# Patient Record
Sex: Female | Born: 1970 | Race: White | Hispanic: No | Marital: Single | Smoking: Never smoker
Health system: Southern US, Community
[De-identification: ages and names within clinical notes are randomized; demographics above are authoritative.]

## PROBLEM LIST (undated history)

## (undated) DIAGNOSIS — S161XXA Strain of muscle, fascia and tendon at neck level, initial encounter: Secondary | ICD-10-CM

## (undated) DIAGNOSIS — I340 Nonrheumatic mitral (valve) insufficiency: Secondary | ICD-10-CM

## (undated) DIAGNOSIS — M797 Fibromyalgia: Secondary | ICD-10-CM

## (undated) DIAGNOSIS — G43909 Migraine, unspecified, not intractable, without status migrainosus: Secondary | ICD-10-CM

## (undated) DIAGNOSIS — R42 Dizziness and giddiness: Secondary | ICD-10-CM

## (undated) DIAGNOSIS — F419 Anxiety disorder, unspecified: Secondary | ICD-10-CM

---

## 2017-12-10 ENCOUNTER — Other Ambulatory Visit: Payer: Self-pay | Admitting: Orthopedic Surgery

## 2017-12-10 DIAGNOSIS — M545 Low back pain: Secondary | ICD-10-CM

## 2017-12-10 DIAGNOSIS — M542 Cervicalgia: Secondary | ICD-10-CM

## 2017-12-22 ENCOUNTER — Ambulatory Visit
Admission: RE | Admit: 2017-12-22 | Discharge: 2017-12-22 | Disposition: A | Discharge status: Home / Self Care | Payer: Medicare Other | Source: Ambulatory Visit | Attending: Orthopedic Surgery | Admitting: Orthopedic Surgery

## 2017-12-22 DIAGNOSIS — M545 Low back pain: Secondary | ICD-10-CM

## 2017-12-22 DIAGNOSIS — M542 Cervicalgia: Secondary | ICD-10-CM

## 2018-10-18 ENCOUNTER — Emergency Department (HOSPITAL_COMMUNITY)
Admission: EM | Admit: 2018-10-18 | Discharge: 2018-10-18 | Disposition: A | Discharge status: Home / Self Care | Payer: Medicare Other | Attending: Emergency Medicine | Admitting: Emergency Medicine

## 2018-10-18 ENCOUNTER — Other Ambulatory Visit: Payer: Self-pay

## 2018-10-18 ENCOUNTER — Encounter (HOSPITAL_COMMUNITY): Payer: Self-pay | Admitting: Emergency Medicine

## 2018-10-18 DIAGNOSIS — R197 Diarrhea, unspecified: Secondary | ICD-10-CM

## 2018-10-18 DIAGNOSIS — R55 Syncope and collapse: Secondary | ICD-10-CM | POA: Diagnosis not present

## 2018-10-18 DIAGNOSIS — R112 Nausea with vomiting, unspecified: Secondary | ICD-10-CM | POA: Diagnosis not present

## 2018-10-18 DIAGNOSIS — Z79899 Other long term (current) drug therapy: Secondary | ICD-10-CM | POA: Diagnosis not present

## 2018-10-18 HISTORY — DX: Fibromyalgia: M79.7

## 2018-10-18 HISTORY — DX: Migraine, unspecified, not intractable, without status migrainosus: G43.909

## 2018-10-18 HISTORY — DX: Strain of muscle, fascia and tendon at neck level, initial encounter: S16.1XXA

## 2018-10-18 HISTORY — DX: Anxiety disorder, unspecified: F41.9

## 2018-10-18 HISTORY — DX: Nonrheumatic mitral (valve) insufficiency: I34.0

## 2018-10-18 HISTORY — DX: Dizziness and giddiness: R42

## 2018-10-18 LAB — CBC
HCT: 41 % (ref 36.0–46.0)
HEMOGLOBIN: 13.4 g/dL (ref 12.0–15.0)
MCH: 29.5 pg (ref 26.0–34.0)
MCHC: 32.7 g/dL (ref 30.0–36.0)
MCV: 90.1 fL (ref 80.0–100.0)
Platelets: 281 10*3/uL (ref 150–400)
RBC: 4.55 MIL/uL (ref 3.87–5.11)
RDW: 12.7 % (ref 11.5–15.5)
WBC: 8 10*3/uL (ref 4.0–10.5)
nRBC: 0 % (ref 0.0–0.2)

## 2018-10-18 LAB — COMPREHENSIVE METABOLIC PANEL
ALK PHOS: 82 U/L (ref 38–126)
ALT: 10 U/L (ref 0–44)
ANION GAP: 10 (ref 5–15)
AST: 18 U/L (ref 15–41)
Albumin: 4 g/dL (ref 3.5–5.0)
BUN: 18 mg/dL (ref 6–20)
CALCIUM: 8.8 mg/dL — AB (ref 8.9–10.3)
CO2: 23 mmol/L (ref 22–32)
Chloride: 102 mmol/L (ref 98–111)
Creatinine, Ser: 0.86 mg/dL (ref 0.44–1.00)
GFR calc Af Amer: >60 mL/min (ref >60)
GFR calc non Af Amer: >60 mL/min (ref >60)
Glucose, Bld: 130 mg/dL — ABNORMAL HIGH (ref 70–99)
Potassium: 3.7 mmol/L (ref 3.5–5.1)
Sodium: 135 mmol/L (ref 135–145)
TOTAL PROTEIN: 7.5 g/dL (ref 6.5–8.1)
Total Bilirubin: 0.6 mg/dL (ref 0.3–1.2)

## 2018-10-18 LAB — I-STAT BETA HCG BLOOD, ED (MC, WL, AP ONLY)

## 2018-10-18 LAB — LIPASE, BLOOD: Lipase: 27 U/L (ref 11–51)

## 2018-10-18 MED ORDER — ONDANSETRON HCL 4 MG/2ML IJ SOLN
4.0000 mg | Freq: Once | INTRAMUSCULAR | Status: AC
  Administered 2018-10-18: 4 mg via INTRAVENOUS
  Filled 2018-10-18: qty 2

## 2018-10-18 MED ORDER — SODIUM CHLORIDE 0.9 % IV BOLUS
1000.0000 mL | Freq: Once | INTRAVENOUS | Status: AC
  Administered 2018-10-18: 1000 mL via INTRAVENOUS

## 2018-10-18 MED ORDER — ONDANSETRON HCL 8 MG PO TABS: 8.0000 mg | ORAL_TABLET | Freq: Three times a day (TID) | ORAL | 0 refills | Status: DC | PRN

## 2018-10-18 MED ORDER — SODIUM CHLORIDE 0.9% FLUSH: 3.0000 mL | Freq: Once | INTRAVENOUS | Status: DC

## 2018-10-18 NOTE — ED Notes (Signed)
Pt had a cup of soda done well 

## 2018-10-18 NOTE — ED Triage Notes (Signed)
Pt c/o vomiting and diarrhea since last night. Pt reports she had a syncopal episode when getting up last night. Pt states daughter had been sick several days ago. Pt also c/o pins and needles feeling to head.

## 2018-10-18 NOTE — Discharge Instructions (Addendum)
Chart with a clear liquid diet then gradually advance to regular foods after a day or 2.  See the doctor of your choice for problems.
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
## 2019-05-16 IMAGING — MR MR CERVICAL SPINE W/O CM
5 series · 29 of 48 positions shown · non-contrast
Comparison: None.

CLINICAL DATA: 46 y/o F; left-sided neck pain with posterior
headache. Numbness in bilateral arms and hands.

EXAM:
MRI CERVICAL SPINE WITHOUT CONTRAST
TECHNIQUE: Multiplanar, multisequence MR imaging of the cervical spine was
performed. No intravenous contrast was administered.

[Series 6: T1 · sagittal · 3.3mm · 0.66mm/px · 6 of 13 slices shown]
[im 1/13]
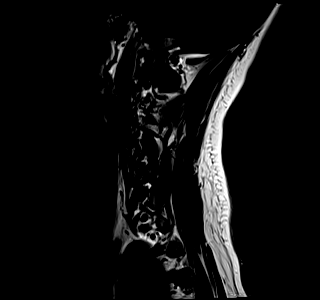
[im 3/13]
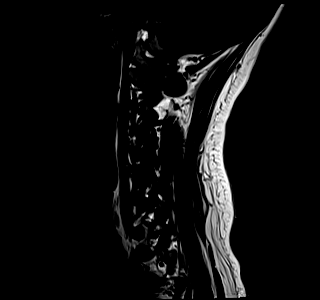
[im 5/13]
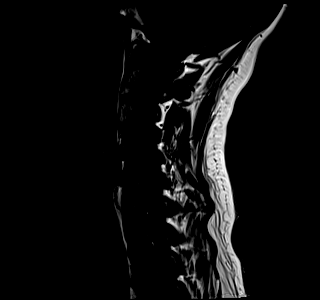
[im 8/13]
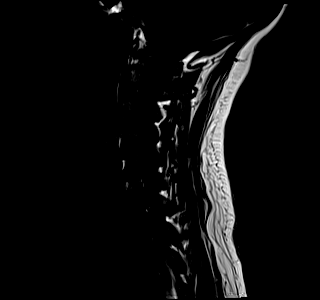
[im 10/13]
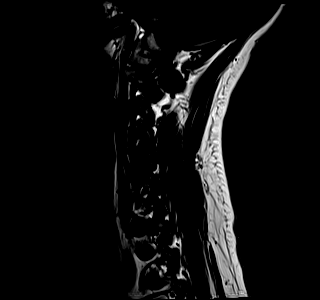
[im 13/13]
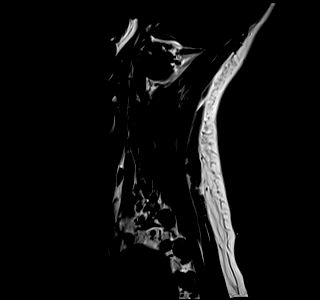

[Series 7: T2 · sagittal · 3.3mm · 0.55mm/px · 7 of 13 slices shown (1 of 2)]
[im 1/13]
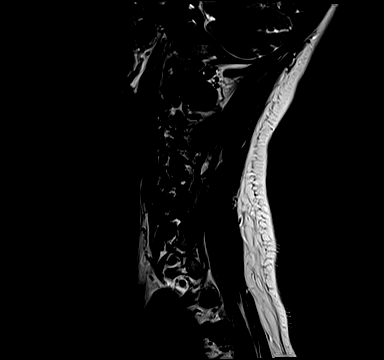
[im 3/13]
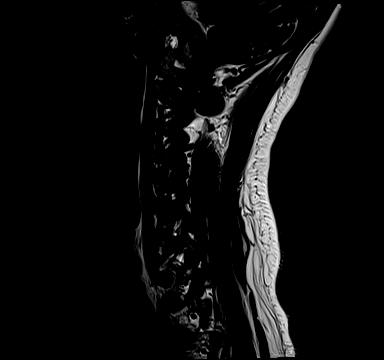
[im 5/13]
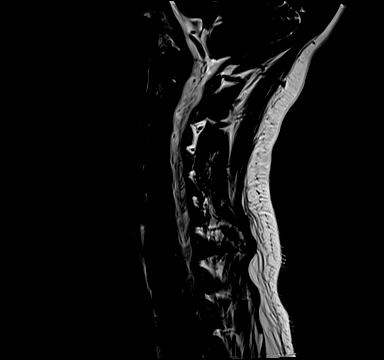
[im 7/13]
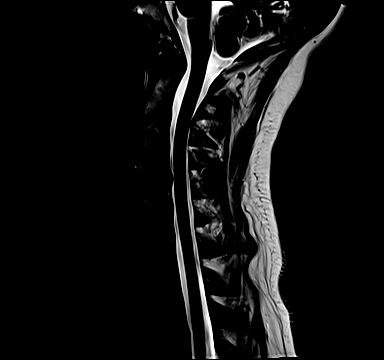
[im 9/13]
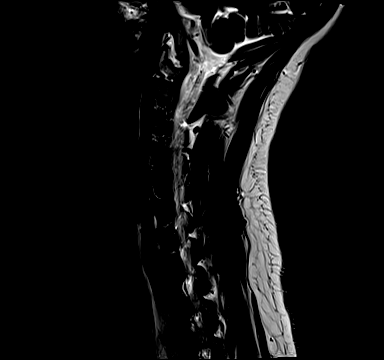
[im 11/13]
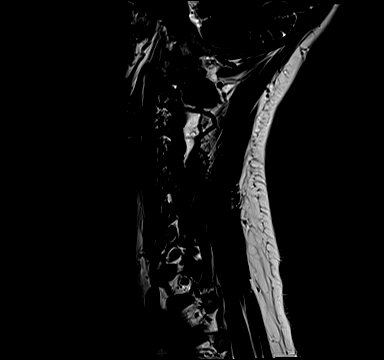
[im 13/13]
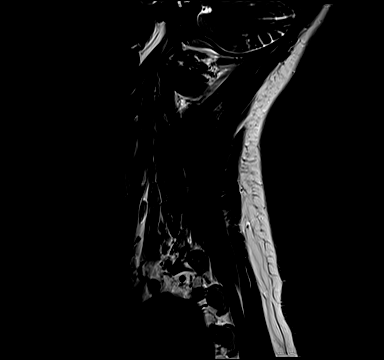

[Series 8: STIR · sagittal · 3.3mm · 0.33mm/px · 7 of 13 slices shown]
[im 1/13]
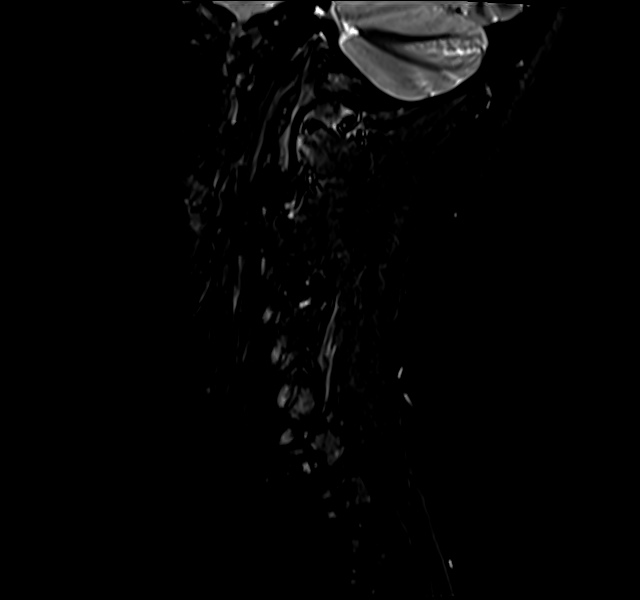
[im 3/13]
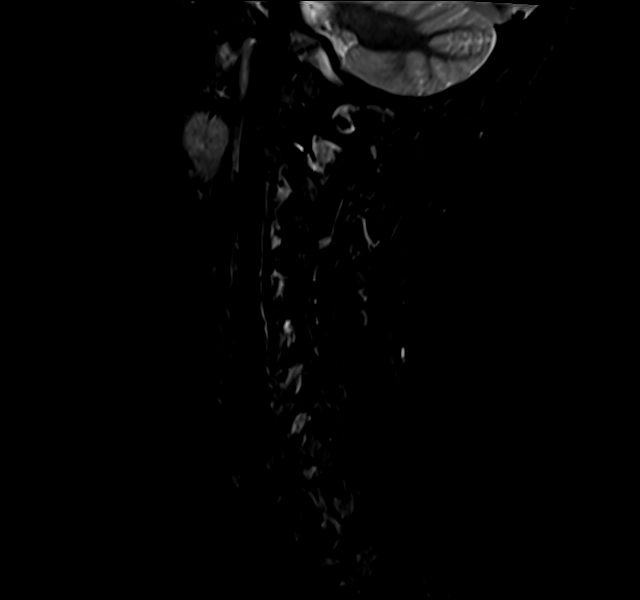
[im 5/13]
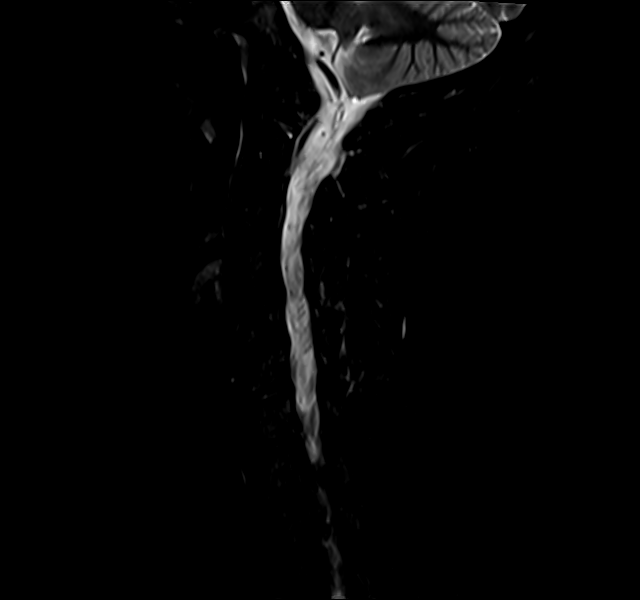
[im 7/13]
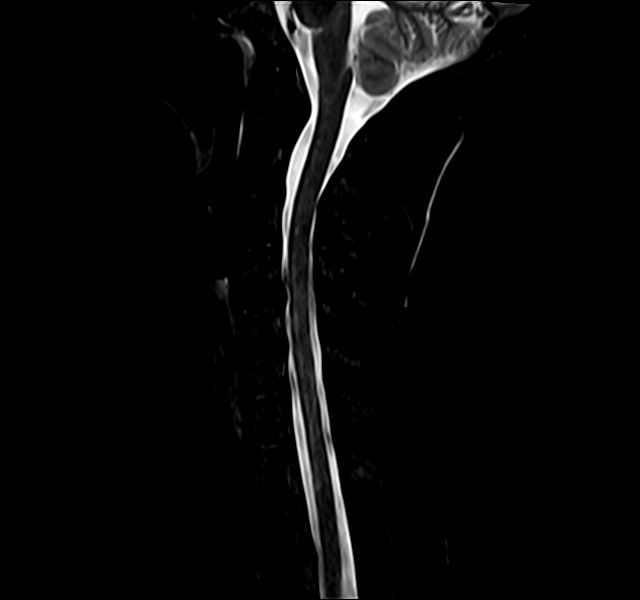
[im 9/13]
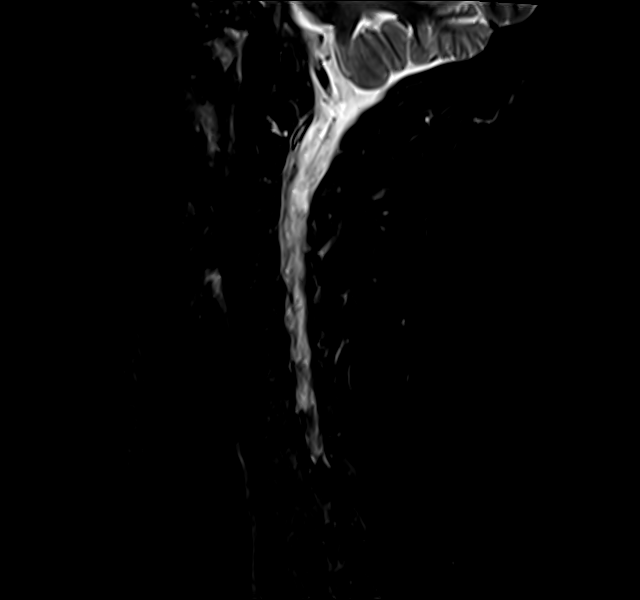
[im 11/13]
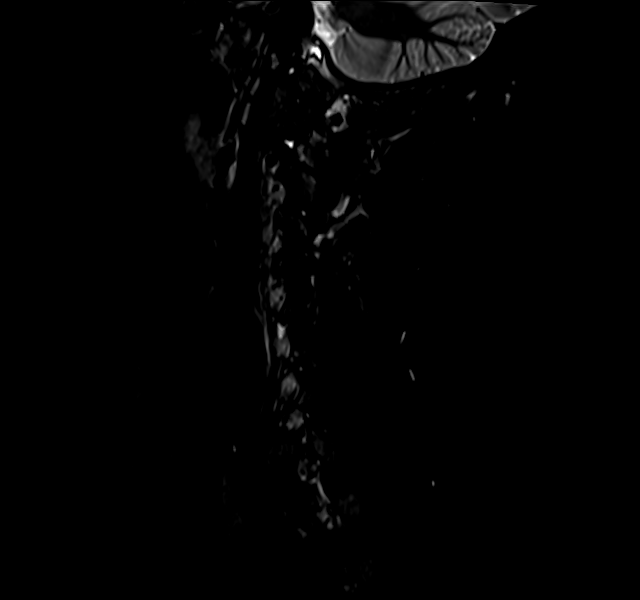
[im 13/13]
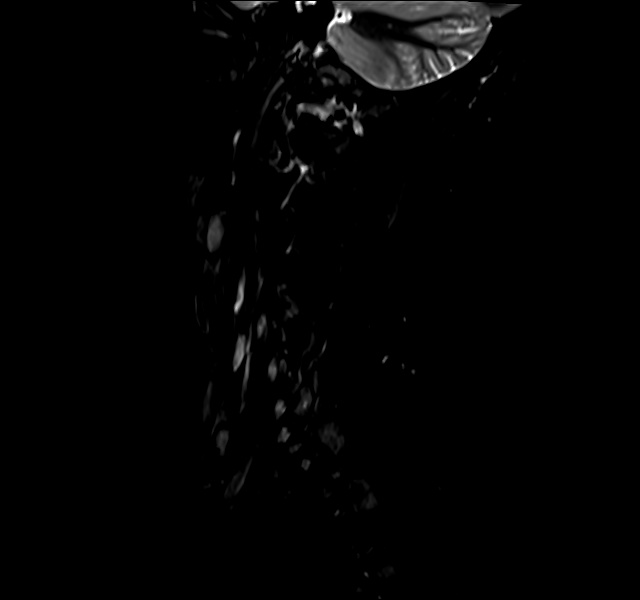

[Series 9: T2 · axial · 3.0mm · 0.53mm/px · z∈[-50,+36]mm · 8 of 28 slices shown (2 of 2)]
[im 1/28]
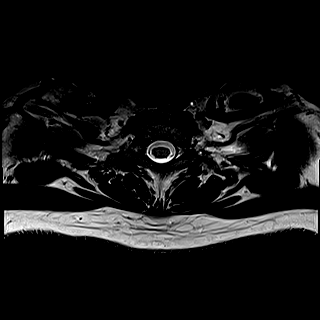
[im 5/28]
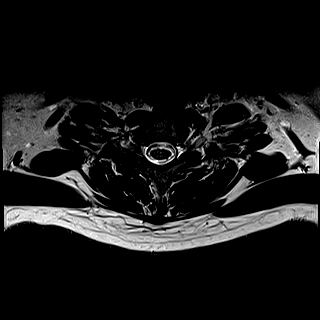
[im 9/28]
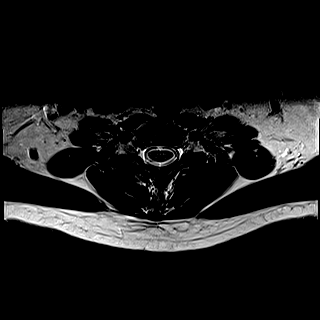
[im 13/28]
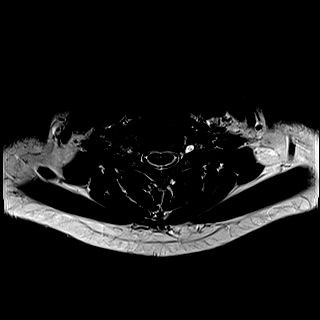
[im 15/28]
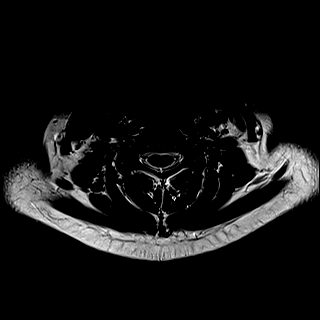
[im 19/28]
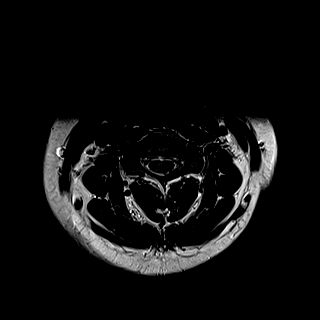
[im 23/28]
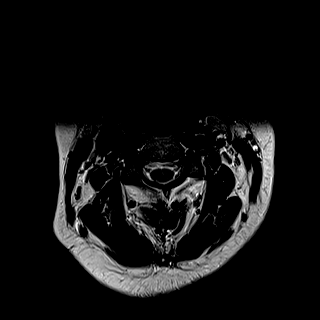
[im 28/28]
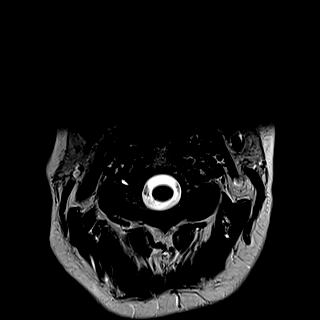

[Series 10: GRE · axial · 3.0mm · 0.66mm/px · 1 of 28 slices shown]
[im 1/28]
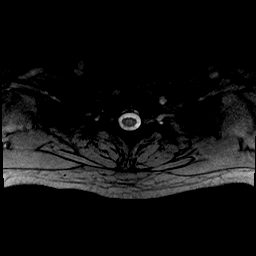

[29 of 48 positions shown; findings below may reference images not displayed]

FINDINGS: Alignment: Physiologic.

Vertebrae: No fracture, evidence of discitis, or bone lesion.

Cord: Normal signal and morphology.

Posterior Fossa, vertebral arteries, paraspinal tissues: Negative.

Disc levels:

C2-3: No significant disc displacement, foraminal stenosis, or canal
stenosis.

C3-4: No significant disc displacement, foraminal stenosis, or canal
stenosis.

C4-5: Small central protrusion with ventral thecal sac effacement.
No significant foraminal or canal stenosis.

C5-6: Small disc bulge with ventral thecal sac effacement. No
significant foraminal or canal stenosis.

C6-7: No significant disc displacement, foraminal stenosis, or canal
stenosis.

C7-T1: No significant disc displacement, foraminal stenosis, or
canal stenosis.
IMPRESSION: 1. No acute osseous or cord signal abnormality.
2. Mild discogenic degenerative changes predominantly at the C4-5
and C5-6 levels.
3. No significant foraminal or canal stenosis.

By: Don Lolito Onacram M.D.

## 2021-08-31 ENCOUNTER — Encounter: Payer: Self-pay | Admitting: Neurology
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
## 2021-11-27 ENCOUNTER — Ambulatory Visit: Payer: Medicare HMO | Admitting: Neurology

## 2022-07-12 DIAGNOSIS — S0083XA Contusion of other part of head, initial encounter: Secondary | ICD-10-CM | POA: Diagnosis not present

## 2022-07-18 DIAGNOSIS — Z136 Encounter for screening for cardiovascular disorders: Secondary | ICD-10-CM | POA: Diagnosis not present

## 2022-07-18 DIAGNOSIS — D649 Anemia, unspecified: Secondary | ICD-10-CM | POA: Diagnosis not present

## 2022-07-18 DIAGNOSIS — E7800 Pure hypercholesterolemia, unspecified: Secondary | ICD-10-CM | POA: Diagnosis not present

## 2022-07-18 DIAGNOSIS — Z0000 Encounter for general adult medical examination without abnormal findings: Secondary | ICD-10-CM | POA: Diagnosis not present

## 2022-07-18 DIAGNOSIS — M5382 Other specified dorsopathies, cervical region: Secondary | ICD-10-CM | POA: Diagnosis not present

## 2022-07-18 DIAGNOSIS — Z862 Personal history of diseases of the blood and blood-forming organs and certain disorders involving the immune mechanism: Secondary | ICD-10-CM | POA: Diagnosis not present

## 2022-07-18 DIAGNOSIS — G500 Trigeminal neuralgia: Secondary | ICD-10-CM | POA: Diagnosis not present

## 2022-07-18 DIAGNOSIS — G43109 Migraine with aura, not intractable, without status migrainosus: Secondary | ICD-10-CM | POA: Diagnosis not present

## 2022-07-18 DIAGNOSIS — Z8639 Personal history of other endocrine, nutritional and metabolic disease: Secondary | ICD-10-CM | POA: Diagnosis not present

## 2022-07-18 DIAGNOSIS — S3992XA Unspecified injury of lower back, initial encounter: Secondary | ICD-10-CM | POA: Diagnosis not present

## 2022-07-18 DIAGNOSIS — E559 Vitamin D deficiency, unspecified: Secondary | ICD-10-CM | POA: Diagnosis not present

## 2022-07-18 DIAGNOSIS — E063 Autoimmune thyroiditis: Secondary | ICD-10-CM | POA: Diagnosis not present

## 2022-08-09 DIAGNOSIS — M542 Cervicalgia: Secondary | ICD-10-CM | POA: Diagnosis not present

## 2022-08-09 DIAGNOSIS — M5451 Vertebrogenic low back pain: Secondary | ICD-10-CM | POA: Diagnosis not present

## 2022-08-10 ENCOUNTER — Encounter (HOSPITAL_BASED_OUTPATIENT_CLINIC_OR_DEPARTMENT_OTHER): Payer: Self-pay | Admitting: Emergency Medicine

## 2022-08-10 ENCOUNTER — Emergency Department (HOSPITAL_BASED_OUTPATIENT_CLINIC_OR_DEPARTMENT_OTHER)
Admission: EM | Admit: 2022-08-10 | Discharge: 2022-08-10 | Disposition: A | Discharge status: Home / Self Care | Payer: Medicare HMO | Attending: Emergency Medicine | Admitting: Emergency Medicine

## 2022-08-10 ENCOUNTER — Other Ambulatory Visit: Payer: Self-pay

## 2022-08-10 DIAGNOSIS — I959 Hypotension, unspecified: Secondary | ICD-10-CM | POA: Diagnosis not present

## 2022-08-10 DIAGNOSIS — R519 Headache, unspecified: Secondary | ICD-10-CM | POA: Insufficient documentation

## 2022-08-10 DIAGNOSIS — R0789 Other chest pain: Secondary | ICD-10-CM | POA: Diagnosis not present

## 2022-08-10 DIAGNOSIS — Z91040 Latex allergy status: Secondary | ICD-10-CM | POA: Diagnosis not present

## 2022-08-10 DIAGNOSIS — G43909 Migraine, unspecified, not intractable, without status migrainosus: Secondary | ICD-10-CM | POA: Diagnosis not present

## 2022-08-10 DIAGNOSIS — T68XXXA Hypothermia, initial encounter: Secondary | ICD-10-CM | POA: Diagnosis not present

## 2022-08-10 DIAGNOSIS — G4489 Other headache syndrome: Secondary | ICD-10-CM | POA: Diagnosis not present

## 2022-08-10 DIAGNOSIS — R0689 Other abnormalities of breathing: Secondary | ICD-10-CM | POA: Diagnosis not present

## 2022-08-10 LAB — CBC
HCT: 37.5 % (ref 36.0–46.0)
Hemoglobin: 12.5 g/dL (ref 12.0–15.0)
MCH: 29.4 pg (ref 26.0–34.0)
MCHC: 33.3 g/dL (ref 30.0–36.0)
MCV: 88.2 fL (ref 80.0–100.0)
Platelets: 295 10*3/uL (ref 150–400)
RBC: 4.25 MIL/uL (ref 3.87–5.11)
RDW: 12.1 % (ref 11.5–15.5)
WBC: 13.4 10*3/uL — ABNORMAL HIGH (ref 4.0–10.5)
nRBC: 0 % (ref 0.0–0.2)

## 2022-08-10 LAB — BASIC METABOLIC PANEL
Anion gap: 11 (ref 5–15)
BUN: 15 mg/dL (ref 6–20)
CO2: 25 mmol/L (ref 22–32)
Calcium: 9.4 mg/dL (ref 8.9–10.3)
Chloride: 103 mmol/L (ref 98–111)
Creatinine, Ser: 0.73 mg/dL (ref 0.44–1.00)
GFR, Estimated: >60 mL/min (ref >60)
Glucose, Bld: 110 mg/dL — ABNORMAL HIGH (ref 70–99)
Potassium: 4 mmol/L (ref 3.5–5.1)
Sodium: 139 mmol/L (ref 135–145)

## 2022-08-10 LAB — TROPONIN I (HIGH SENSITIVITY)
Troponin I (High Sensitivity): <2 ng/L (ref <18)
Troponin I (High Sensitivity): <2 ng/L (ref <18)

## 2022-08-10 LAB — PREGNANCY, URINE: Preg Test, Ur: NEGATIVE

## 2022-08-10 MED ORDER — DEXAMETHASONE SODIUM PHOSPHATE 10 MG/ML IJ SOLN
10.0000 mg | Freq: Once | INTRAMUSCULAR | Status: AC
  Administered 2022-08-10: 10 mg via INTRAVENOUS
  Filled 2022-08-10: qty 1

## 2022-08-10 MED ORDER — SODIUM CHLORIDE 0.9 % IV BOLUS
1000.0000 mL | Freq: Once | INTRAVENOUS | Status: AC
  Administered 2022-08-10: 1000 mL via INTRAVENOUS

## 2022-08-10 MED ORDER — DIPHENHYDRAMINE HCL 50 MG/ML IJ SOLN
25.0000 mg | Freq: Once | INTRAMUSCULAR | Status: AC
  Administered 2022-08-10: 25 mg via INTRAVENOUS
  Filled 2022-08-10: qty 1

## 2022-08-10 MED ORDER — SODIUM CHLORIDE 0.9 % IV SOLN: INTRAVENOUS | Status: DC

## 2022-08-10 MED ORDER — METOCLOPRAMIDE HCL 5 MG/ML IJ SOLN
10.0000 mg | Freq: Once | INTRAMUSCULAR | Status: AC
  Administered 2022-08-10: 10 mg via INTRAVENOUS
  Filled 2022-08-10: qty 2

## 2022-08-10 NOTE — ED Notes (Signed)
Reviewed AVS/discharge instruction with patient. Time allotted for and all questions answered. Patient is agreeable for d/c and escorted to ed exit by staff.  

## 2022-08-10 NOTE — Discharge Instructions (Signed)
Your appointment will [facility] neurology.  Today's work-up no evidence of any heart problems.  Labs without any significant abnormalities.  Will rely on neurology to think that this is a migraine equivalent type set of symptoms.  But certainly additional work-up is required.  Return for any new or worse symptoms

## 2022-08-10 NOTE — ED Notes (Signed)
Added troponin to lab. Spoke with Shanda Bumps.

## 2022-08-10 NOTE — ED Provider Notes (Signed)
[facility] EMERGENCY DEPT Provider Note   CSN: [id number] Arrival date & time: 08/10/22  1649     History  Chief Complaint  Patient presents with   Migraine    Gabriela Li is a 51 y.o. female.  Patient with a history of headaches that come and go that they thought in the past were migraines.  But patient gets a very strange symptoms with them where she feels like she is going to pass out this has been ongoing for years.  She sometimes gets some tingling sensations as well.  Currently the headache has resolved.  Patient in the past has been told it is all migraine related but then more recently they were not so certain.  She has follow-up with [facility] neurology on Monday.  Denies any fevers denies any abdominal pain or or visual changes or any focal motor weakness.  No speech problems.  But does have some chest discomfort with it as well.       Home Medications Prior to Admission medications   Medication Sig Start Date End Date Taking? Authorizing Provider  ondansetron (ZOFRAN) 8 MG tablet Take 1 tablet (8 mg total) by mouth every 8 (eight) hours as needed for nausea or vomiting. 10/18/18   [name], MD  Probiotic Product (PROBIOTIC-10 PO) Take 1 capsule by mouth daily.    [provider]      Allergies    Latex, Codeine, and Penicillins    Review of Systems   Review of Systems  Constitutional:  Negative for chills and fever.  HENT:  Negative for rhinorrhea and sore throat.   Eyes:  Negative for photophobia, redness and visual disturbance.  Respiratory:  Negative for cough and shortness of breath.   Cardiovascular:  Positive for chest pain. Negative for leg swelling.  Gastrointestinal:  Positive for nausea. Negative for abdominal pain, diarrhea and vomiting.  Genitourinary:  Negative for dysuria.  Musculoskeletal:  Negative for back pain and neck pain.  Skin:  Negative for rash.  Neurological:  Positive for numbness and headaches.  Negative for dizziness, speech difficulty, weakness and light-headedness.  Hematological:  Does not bruise/bleed easily.  Psychiatric/Behavioral:  Negative for confusion.     Physical Exam Updated Vital Signs BP 115/71   Pulse 88   Temp 98.9 F (37.2 C) (Oral)   Resp 18   Ht 1.6 m (5\' 3" )   Wt 72.6 kg   LMP 08/07/2022 (Approximate)   SpO2 94%   BMI 28.34 kg/m  Physical Exam Vitals and nursing note reviewed.  Constitutional:      General: She is not in acute distress.    Appearance: Normal appearance. She is well-developed.  HENT:     Head: Normocephalic and atraumatic.     Mouth/Throat:     Mouth: Mucous membranes are moist.  Eyes:     Extraocular Movements: Extraocular movements intact.     Conjunctiva/sclera: Conjunctivae normal.     Pupils: Pupils are equal, round, and reactive to light.  Cardiovascular:     Rate and Rhythm: Normal rate and regular rhythm.     Heart sounds: No murmur heard. Pulmonary:     Effort: Pulmonary effort is normal. No respiratory distress.     Breath sounds: Normal breath sounds.  Abdominal:     Palpations: Abdomen is soft.     Tenderness: There is no abdominal tenderness.  Musculoskeletal:        General: No swelling.     Cervical back: Normal range of motion  and neck supple.  Skin:    General: Skin is warm and dry.     Capillary Refill: Capillary refill takes less than 2 seconds.  Neurological:     General: No focal deficit present.     Mental Status: She is alert and oriented to person, place, and time.     Cranial Nerves: No cranial nerve deficit.     Sensory: No sensory deficit.     Motor: No weakness.     Coordination: Coordination normal.  Psychiatric:        Mood and Affect: Mood normal.     ED Results / Procedures / Treatments   Labs (all labs ordered are listed, but only abnormal results are displayed) Labs Reviewed  BASIC METABOLIC PANEL - Abnormal; Notable for the following components:      Result Value    Glucose, Bld 110 (*)    All other components within normal limits  CBC - Abnormal; Notable for the following components:   WBC 13.4 (*)    All other components within normal limits  PREGNANCY, URINE  TROPONIN I (HIGH SENSITIVITY)  TROPONIN I (HIGH SENSITIVITY)  TROPONIN I (HIGH SENSITIVITY)    EKG EKG Interpretation  Date/Time:  Saturday August 10 2022 18:40:34 EST Ventricular Rate:  85 PR Interval:  164 QRS Duration: 78 QT Interval:  404 QTC Calculation: 480 R Axis:   76 Text Interpretation: Normal sinus rhythm Prolonged QT Abnormal ECG No previous ECGs available Confirmed by Vanetta Mulders [phone]) on 08/10/2022 6:52:17 PM  Radiology No results found.  Procedures Procedures    Medications Ordered in ED Medications  0.9 %  sodium chloride infusion ( Intravenous New Bag/Given 08/10/22 1910)  sodium chloride 0.9 % bolus 1,000 mL (0 mLs Intravenous Stopped 08/10/22 1903)  metoCLOPramide (REGLAN) injection 10 mg (10 mg Intravenous Given 08/10/22 1801)  diphenhydrAMINE (BENADRYL) injection 25 mg (25 mg Intravenous Given 08/10/22 1802)  dexamethasone (DECADRON) injection 10 mg (10 mg Intravenous Given 08/10/22 1801)    ED Course/ Medical Decision Making/ A&P                           Medical Decision Making Amount and/or Complexity of Data Reviewed Labs: ordered.  Risk Prescription drug management.   Work-up here without any acute findings.  Patient given migraine cocktail headache resolved.  But the other symptoms persisted.  Patient stated that she has recently had CT head without any acute findings.  For the chest pain work-up troponins x2 were normal.  Basic metabolic panel without any acute abnormalities.  CBC showed a mild leukocytosis patient without any concerns about an infectious process hemoglobin was normal.  Think that the white blood count may be up a little bit just due to the headache.  Pregnancy test negative.  EKG without any acute findings.  It was  some prolonged QT and patient did receive migraine cocktail IV fluids that included Reglan.  But patient tolerated that fine.  Cardiac monitoring without any acute findings.  Patient did not want chest x-ray.  Patient overall feeling better has follow-up with [facility] neurology on Monday which I think is appropriate and patient can be discharged. Final Clinical Impression(s) / ED Diagnoses Final diagnoses:  Chronic nonintractable headache, unspecified headache type    Rx / DC Orders ED Discharge Orders     None         Vanetta Mulders, MD 08/10/22 2120

## 2022-08-10 NOTE — ED Triage Notes (Signed)
Pt via ems from home with migraine. Pt has hx of the same; she doesn't take any medications at home. Pt actively vomiting during triage, had 4 of zofran en route. Per ems, pt was unable to ambulate out of home and was removed in stair chair.   Pt alert & oriented, nad noted.

## 2022-08-10 NOTE — ED Notes (Signed)
Patient denies pain and is resting comfortably.  

## 2022-08-12 ENCOUNTER — Telehealth: Payer: Self-pay | Admitting: Psychiatry

## 2022-08-12 ENCOUNTER — Encounter: Payer: Self-pay | Admitting: Psychiatry

## 2022-08-12 ENCOUNTER — Ambulatory Visit: Payer: Medicare HMO | Admitting: Psychiatry

## 2022-08-12 VITALS — BP 104/72 | HR 78 | Ht 63.0 in | Wt 162.0 lb

## 2022-08-12 DIAGNOSIS — R42 Dizziness and giddiness: Secondary | ICD-10-CM | POA: Diagnosis not present

## 2022-08-12 DIAGNOSIS — G500 Trigeminal neuralgia: Secondary | ICD-10-CM | POA: Diagnosis not present

## 2022-08-12 DIAGNOSIS — G43E19 Chronic migraine with aura, intractable, without status migrainosus: Secondary | ICD-10-CM | POA: Diagnosis not present

## 2022-08-12 DIAGNOSIS — R55 Syncope and collapse: Secondary | ICD-10-CM | POA: Diagnosis not present

## 2022-08-12 DIAGNOSIS — W57XXXS Bitten or stung by nonvenomous insect and other nonvenomous arthropods, sequela: Secondary | ICD-10-CM

## 2022-08-12 MED ORDER — RIZATRIPTAN BENZOATE 10 MG PO TBDP: 10.0000 mg | ORAL_TABLET | ORAL | 11 refills | Status: AC | PRN

## 2022-08-12 MED ORDER — NORTRIPTYLINE HCL 10 MG PO CAPS: ORAL_CAPSULE | ORAL | 6 refills | Status: DC

## 2022-08-12 NOTE — Telephone Encounter (Signed)
Referral sent to [facility], phone # [phone]. 

## 2022-08-12 NOTE — Patient Instructions (Addendum)
Plan: -Blood work to check for Lyme disease -MRI of the brain -Start nortriptyline for headache prevention. Take 1 pill at bedtime for 1 week, then increase to 2 pills at bedtime -Start rizatriptan (Maxalt) as needed for migraines. Take one pill at onset of migraine, may repeat a dose in 2 hours if headache persists. Max dose 2 pills in 24 hours. -Referral to ENT for vertigo
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
## 2022-08-13 LAB — LYME DISEASE SEROLOGY W/REFLEX: Lyme Total Antibody EIA: NEGATIVE

## 2022-08-16 DIAGNOSIS — S3992XA Unspecified injury of lower back, initial encounter: Secondary | ICD-10-CM | POA: Diagnosis not present

## 2022-08-16 DIAGNOSIS — D72829 Elevated white blood cell count, unspecified: Secondary | ICD-10-CM | POA: Diagnosis not present

## 2022-08-16 DIAGNOSIS — E559 Vitamin D deficiency, unspecified: Secondary | ICD-10-CM | POA: Diagnosis not present

## 2022-08-16 DIAGNOSIS — Z6827 Body mass index (BMI) 27.0-27.9, adult: Secondary | ICD-10-CM | POA: Diagnosis not present

## 2022-08-16 DIAGNOSIS — R200 Anesthesia of skin: Secondary | ICD-10-CM | POA: Diagnosis not present

## 2022-08-19 ENCOUNTER — Telehealth: Payer: Self-pay | Admitting: Psychiatry

## 2022-08-19 NOTE — Telephone Encounter (Signed)
Ethlyn Gallery: [phone] exp. 08/19/22-09/18/22 sent to GI [phone]

## 2022-08-20 ENCOUNTER — Other Ambulatory Visit: Payer: Self-pay | Admitting: Family Medicine

## 2022-08-20 DIAGNOSIS — M549 Dorsalgia, unspecified: Secondary | ICD-10-CM

## 2022-09-12 ENCOUNTER — Other Ambulatory Visit: Payer: Medicare HMO

## 2022-10-04 ENCOUNTER — Other Ambulatory Visit: Payer: Medicare HMO

## 2022-10-07 ENCOUNTER — Other Ambulatory Visit: Payer: Medicare HMO

## 2022-10-18 DIAGNOSIS — H9311 Tinnitus, right ear: Secondary | ICD-10-CM | POA: Diagnosis not present

## 2022-11-12 ENCOUNTER — Telehealth: Payer: Self-pay | Admitting: Psychiatry

## 2022-11-12 ENCOUNTER — Telehealth: Payer: Self-pay

## 2022-11-12 NOTE — Telephone Encounter (Signed)
She can keep the appointment. We can still discuss her symptoms and adjust medications if needed, even without the MRI

## 2022-11-12 NOTE — Telephone Encounter (Signed)
Noted  

## 2022-11-12 NOTE — Telephone Encounter (Signed)
Please call pt back and let pt know Dr. Georgina Peer message. Thank you

## 2022-11-12 NOTE — Telephone Encounter (Signed)
Patient called in, she has an upcoming f/u appt on 2/15 at 9:30 with Dr. Billey Gosling. She wanted to make sure this appointment was okay to keep. Stated she was unable to get the MRI's done because she could not afford the $800 copay they were requesting. She just wanted to make sure she wasn't wasting anyone's time. Please advise if appt needs to be changed or okay to still keep. I figured it would be fine and we could maybe look at other options for her, but just wanted to check

## 2022-11-12 NOTE — Telephone Encounter (Signed)
Dr. Billey Gosling- can you review?

## 2022-11-12 NOTE — Telephone Encounter (Signed)
Please read message from today 11/12/2022
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
## 2022-11-14 ENCOUNTER — Ambulatory Visit: Payer: Medicare HMO | Admitting: Psychiatry

## 2022-11-14 ENCOUNTER — Encounter: Payer: Self-pay | Admitting: Psychiatry

## 2022-11-14 VITALS — BP 114/81 | HR 73 | Ht 63.0 in | Wt 162.5 lb

## 2022-11-14 DIAGNOSIS — G43E19 Chronic migraine with aura, intractable, without status migrainosus: Secondary | ICD-10-CM

## 2022-11-14 DIAGNOSIS — G500 Trigeminal neuralgia: Secondary | ICD-10-CM | POA: Diagnosis not present

## 2022-11-14 DIAGNOSIS — R404 Transient alteration of awareness: Secondary | ICD-10-CM | POA: Diagnosis not present

## 2022-11-14 MED ORDER — DULOXETINE HCL 20 MG PO CPEP: 20.0000 mg | ORAL_CAPSULE | Freq: Every day | ORAL | 6 refills | Status: DC

## 2022-11-14 NOTE — Patient Instructions (Addendum)
Plan: -Please call to schedule an MRI of your brain with contrast -EEG -Start Cymbalta (duloxetine) 20 mg daily for headaches and facial pain  Guidelines for the Management of Orthostatic intolerance  1. Make all postural changes from lying to sitting or sitting to standing, slowly.  2. Drink to 2.0 -2.5 L of fluids per day.  3. Increase sodium in the diet to 3 - 5 g per day.  4. Avoid large meals which can cause low blood pressure during digestion. It is better to eat smaller meals more often than three large meals.  5. Avoid alcohol. Alcohol and cause blood to pool in the legs which may worsen low blood pressure reactions when standing. This can aggravate POTS.  6. Perform lower extremity exercises to improve strength of the leg muscles. This will help prevent blood from a pooling in the legs when standing and walking.  7. Raise the head of the bed by 6 to 10 inches. The entire bed must be at an angle. Raising only the head portion of the bed at waist level or using pillows will not be effective. Raising the head of the bed will reduce urine formation overnight and there will be more volume in the circulation in the morning.  8. During bad days, drink 500 cc of water quickly. This will result in an increased blood pressure within 5 minutes of drinking the water. The effect will last up to one hour and may improve orthostatic intolerance.  9. Use custom fitted elastic support stockings. These will reduce a tendency for blood to pool in the legs when standing and may improve orthostatic intolerance.  10. Use physical counter maneuvers such as leg crossing, squatting, or raising and resting the leg on a chair. These maneuvers increase blood pressure and can improve orthostatic intolerance.  11. Aerobic conditioning exercise program.

## 2022-11-27 ENCOUNTER — Ambulatory Visit: Payer: Medicare HMO | Admitting: Neurology

## 2022-11-27 DIAGNOSIS — R4182 Altered mental status, unspecified: Secondary | ICD-10-CM

## 2022-11-27 DIAGNOSIS — R404 Transient alteration of awareness: Secondary | ICD-10-CM

## 2022-11-28 NOTE — Procedures (Signed)
    History:  52 year old woman with paroxysmal events concerning for seizure   EEG classification: Awake and drowsy  Description of the recording: The background rhythms of this recording consists of a fairly well modulated medium amplitude alpha rhythm of 10 Hz that is reactive to eye opening and closure. Present in the anterior head region is a 15-20 Hz beta activity. Photic stimulation was performed, did not show any abnormalities. Hyperventilation was also performed, did not show any abnormalities. Drowsiness was manifested by background fragmentation. No abnormal epileptiform discharges seen during this recording. There was no focal slowing. There were no electrographic seizure identified.   Abnormality: None   Impression: This is a normal EEG recorded while drowsy and awake. No evidence of interictal epileptiform discharges. Normal EEGs, however, do not rule out epilepsy.    Alric Ran, MD [facility]

## 2023-02-12 DIAGNOSIS — E559 Vitamin D deficiency, unspecified: Secondary | ICD-10-CM | POA: Diagnosis not present

## 2023-02-12 DIAGNOSIS — Z6827 Body mass index (BMI) 27.0-27.9, adult: Secondary | ICD-10-CM | POA: Diagnosis not present

## 2023-02-12 DIAGNOSIS — R21 Rash and other nonspecific skin eruption: Secondary | ICD-10-CM | POA: Diagnosis not present

## 2023-02-12 DIAGNOSIS — Z20828 Contact with and (suspected) exposure to other viral communicable diseases: Secondary | ICD-10-CM | POA: Diagnosis not present

## 2023-02-12 DIAGNOSIS — R1012 Left upper quadrant pain: Secondary | ICD-10-CM | POA: Diagnosis not present

## 2023-02-12 DIAGNOSIS — E782 Mixed hyperlipidemia: Secondary | ICD-10-CM | POA: Diagnosis not present

## 2023-02-12 DIAGNOSIS — R55 Syncope and collapse: Secondary | ICD-10-CM | POA: Diagnosis not present

## 2023-03-27 DIAGNOSIS — R55 Syncope and collapse: Secondary | ICD-10-CM | POA: Diagnosis not present

## 2023-04-04 ENCOUNTER — Ambulatory Visit: Payer: Medicare HMO | Admitting: Cardiology

## 2023-04-04 ENCOUNTER — Encounter: Payer: Self-pay | Admitting: Cardiology

## 2023-04-04 ENCOUNTER — Other Ambulatory Visit: Payer: Medicare HMO

## 2023-04-04 VITALS — BP 104/71 | HR 85 | Resp 16 | Ht 63.0 in | Wt 161.8 lb

## 2023-04-04 DIAGNOSIS — G9001 Carotid sinus syncope: Secondary | ICD-10-CM

## 2023-04-04 DIAGNOSIS — R55 Syncope and collapse: Secondary | ICD-10-CM | POA: Diagnosis not present

## 2023-04-04 DIAGNOSIS — E7800 Pure hypercholesterolemia, unspecified: Secondary | ICD-10-CM | POA: Diagnosis not present

## 2023-04-04 DIAGNOSIS — M5382 Other specified dorsopathies, cervical region: Secondary | ICD-10-CM

## 2023-04-04 MED ORDER — PROPRANOLOL HCL ER 80 MG PO CP24
80.0000 mg | ORAL_CAPSULE | Freq: Every day | ORAL | 2 refills | Status: AC
Start: 2023-04-04 — End: ?

## 2023-04-04 MED ORDER — EZETIMIBE-SIMVASTATIN 10-40 MG PO TABS
1.0000 | ORAL_TABLET | Freq: Every day | ORAL | 2 refills | Status: AC
Start: 2023-04-04 — End: ?

## 2023-04-04 NOTE — Progress Notes (Unsigned)
Primary Physician/Referring:  Inez Pilgrim, NP  Patient ID: Gabriela Li, female    DOB: [birth year], 52 y.o.   MRN: [id number]  Chief Complaint  Patient presents with   Near Syncope   HPI:    Gabriela Li  is a 52 y.o. female patient with fibromyalgia, trigeminal neuralgia, kyphosis, dysautonomia, migraine headaches, history of Hashimoto's thyroiditis presenting with near syncope.  Patient has complex presentation and states that she is extremely sensitive to heat, with hot shower she completely loses her control of her body including involuntary urination and sometimes defecation.  She reports that she has been diagnosed with sternocleidomastoid syndrome with severe neck pain and associated facial pains.***  Past Medical History:  Diagnosis Date   Anxiety    Fibromyalgia    Migraines    Mitral valve regurgitation    Strain of sternocleidomastoid muscle    Vertigo    Past Surgical History:  Procedure Laterality Date   CESAREAN SECTION     Family History  Problem Relation Age of Onset   Breast cancer Mother    Stroke Father    Breast cancer Maternal Grandmother     Social History   Tobacco Use   Smoking status: Never   Smokeless tobacco: Never  Substance Use Topics   Alcohol use: Never   Marital Status: Single  ROS  ***ROS Objective      04/04/2023   11:39 AM 11/14/2022    9:41 AM 08/12/2022    9:13 AM  Vitals with BMI  Height 5\' 3"  5\' 3"  5\' 3"   Weight 161 lbs 13 oz 162 lbs 8 oz 162 lbs  BMI 28.67 28.79 28.7  Systolic 104 114 213  Diastolic 71 81 72  Pulse 85 73 78   Blood pressure 104/71, pulse 85, resp. rate 16, height 5\' 3"  (1.6 m), weight 161 lb 12.8 oz (73.4 kg), SpO2 97 %.   ***Physical Exam  Laboratory examination:   Recent Labs    08/10/22 1745  NA 139  K 4.0  CL 103  CO2 25  GLUCOSE 110*  BUN 15  CREATININE 0.73  CALCIUM 9.4  GFRNONAA >60    Lab Results  Component Value Date   GLUCOSE 110 (H) 08/10/2022   NA 139  08/10/2022   K 4.0 08/10/2022   CL 103 08/10/2022   CO2 25 08/10/2022   BUN 15 08/10/2022   CREATININE 0.73 08/10/2022   GFRNONAA >60 08/10/2022   CALCIUM 9.4 08/10/2022   PROT 7.5 10/18/2018   ALBUMIN 4.0 10/18/2018   BILITOT 0.6 10/18/2018   ALKPHOS 82 10/18/2018   AST 18 10/18/2018   ALT 10 10/18/2018   ANIONGAP 11 08/10/2022      Lab Results  Component Value Date   ALT 10 10/18/2018   AST 18 10/18/2018   ALKPHOS 82 10/18/2018   BILITOT 0.6 10/18/2018       Latest Ref Rng & Units 08/10/2022    5:45 PM 10/18/2018   11:41 AM  CBC  WBC 4.0 - 10.5 K/uL 13.4  8.0   Hemoglobin 12.0 - 15.0 g/dL 08.6  57.8   Hematocrit 36.0 - 46.0 % 37.5  41.0   Platelets 150 - 400 K/uL 295  281        Latest Ref Rng & Units 10/18/2018   11:41 AM  Hepatic Function  Total Protein 6.5 - 8.1 g/dL 7.5   Albumin 3.5 - 5.0 g/dL 4.0   AST 15 - 41 U/L 18   ALT 0 -  44 U/L 10   Alk Phosphatase 38 - 126 U/L 82   Total Bilirubin 0.3 - 1.2 mg/dL 0.6    External labs:   Labs 03/27/2023: TSH normal at 1.25.  Serum glucose 87 mg, BUN 10, creatinine 0.75, EGFR 97 mill, potassium 4.3, LFTs normal.  Cholesterol, total 278.000 m 02/12/2023 HDL 59.000 mg 02/12/2023 LDL 192.000 m 02/12/2023 Triglycerides 151.000 m 02/12/2023  Radiology:    Cardiac Studies:   NA  EKG:   EKG 04/04/2023: Normal sinus rhythm at rate of 88 bpm, normal axis, incomplete right bundle branch block.  No evidence of ischemia, otherwise normal EKG. normal QT interval  EKG 08/10/2022: Normal sinus rhythm with rate of 85 bpm, normal axis, incomplete right bundle branch block.  Borderline prolonged QTc at 480 ms. Medications and allergies   Allergies  Allergen Reactions   Latex Other (See Comments)   Erythromycin Base Nausea And Vomiting   Hydrocodone-Acetaminophen Nausea And Vomiting   Topiramate Other (See Comments) and Swelling    Visual  Hallucinations   Codeine    Penicillins     Did it involve swelling of the  face/tongue/throat, SOB, or low BP? N Did it involve sudden or severe rash/hives, skin peeling, or any reaction on the inside of your mouth or nose? Y Did you need to seek medical attention at a hospital or doctor's office? Y When did it last happen?  Child If all above answers are "NO", may proceed with cephalosporin use.      Medication list   Current Outpatient Medications:    ergocalciferol (VITAMIN D2) 1.25 MG (50000 UT) capsule, 1 capsule Orally once a week for 30 days, Disp: , Rfl:    ezetimibe-simvastatin (VYTORIN) 10-40 MG tablet, Take 1 tablet by mouth daily at 6 PM., Disp: 30 tablet, Rfl: 2   Probiotic Product (PROBIOTIC-10 PO), Take 1 capsule by mouth daily., Disp: , Rfl:    propranolol ER (INDERAL LA) 80 MG 24 hr capsule, Take 1 capsule (80 mg total) by mouth daily., Disp: 30 capsule, Rfl: 2   rizatriptan (MAXALT-MLT) 10 MG disintegrating tablet, Take 1 tablet (10 mg total) by mouth as needed for migraine. May repeat in 2 hours if needed. Max dose 2 pills in 24 hours, Disp: 9 tablet, Rfl: 11  Assessment     ICD-10-CM   1. 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 Hypercholesteremia  E78.00 ezetimibe-simvastatin (VYTORIN) 10-40 MG tablet    4. Musculoskeletal disorder involving sternocleidomastoid  M53.82 Ambulatory referral to Physical Medicine Rehab       Orders Placed This Encounter  Procedures   Ambulatory referral to Physical Medicine Rehab    Referral Priority:   Routine    Referral Type:   Rehabilitation    Referral Reason:   Specialty Services Required    Requested Specialty:   Physical Medicine and Rehabilitation    Number of Visits Requested:   1   LONG TERM  MONITOR (3-14 DAYS)    Standing Status:   Future    Standing Expiration Date:   04/03/2024    Order Specific Question:  Where should this test be performed?    Answer:   [facility]    Order Specific Question:   Does the patient have an implanted cardiac device?    Answer:   No    Order Specific Question:   Prescribed days of wear    Answer:   78    Order Specific Question:   Type of enrollment    Answer:   Clinic Enrollment    Order Specific Question:   Release to patient    Answer:   Immediate   [facility] CARDIAC STRESS TEST    Standing Status:   Future    Standing Expiration Date:   06/05/2023   EKG 12-Lead   [facility] ECHOCARDIOGRAM COMPLETE    Standing Status:   Future    Standing Expiration Date:   04/03/2024    Meds ordered this encounter  Medications   propranolol ER (INDERAL LA) 80 MG 24 hr capsule    Sig: Take 1 capsule (80 mg total) by mouth daily.    Dispense:  30 capsule    Refill:  2   ezetimibe-simvastatin (VYTORIN) 10-40 MG tablet    Sig: Take 1 tablet by mouth daily at 6 PM.    Dispense:  30 tablet    Refill:  2    Medications Discontinued During This Encounter  Medication Reason   DULoxetine (CYMBALTA) 20 MG capsule    ondansetron (ZOFRAN) 8 MG tablet      Recommendations:   Angelynn Harrop is a 52 y.o.  ***    Yates Decamp, MD, [facility] 04/04/2023, 12:26 PM Office: [phone]

## 2023-04-08 DIAGNOSIS — K921 Melena: Secondary | ICD-10-CM | POA: Diagnosis not present

## 2023-04-08 DIAGNOSIS — R140 Abdominal distension (gaseous): Secondary | ICD-10-CM | POA: Diagnosis not present

## 2023-04-08 DIAGNOSIS — R1012 Left upper quadrant pain: Secondary | ICD-10-CM | POA: Diagnosis not present

## 2023-04-08 DIAGNOSIS — R109 Unspecified abdominal pain: Secondary | ICD-10-CM | POA: Diagnosis not present

## 2023-04-08 DIAGNOSIS — K5900 Constipation, unspecified: Secondary | ICD-10-CM | POA: Diagnosis not present

## 2023-04-22 DIAGNOSIS — R55 Syncope and collapse: Secondary | ICD-10-CM | POA: Diagnosis not present

## 2023-04-22 DIAGNOSIS — G9001 Carotid sinus syncope: Secondary | ICD-10-CM | POA: Diagnosis not present

## 2023-04-23 NOTE — Progress Notes (Signed)
Zio Patch Extended out patient EKG monitoring 10 days days starting 04/04/2023:  Predominant Rhythm : Normal sinus rhythm. Min HR: 55 bpm at 10 AM. Max HR 147 bpm at 6:30 PM  Atrial arrhythmias: Rare PACs  Atrial fibrillation: None  Ventricular arrhythmias: Rare PVCs. PVC Burden <0.1%  Heart Block: None  Symptoms: No symptoms reported.  Unremarkable event monitoring for 10 days.

## 2023-04-28 ENCOUNTER — Other Ambulatory Visit: Payer: Medicare HMO

## 2023-05-27 DIAGNOSIS — G518 Other disorders of facial nerve: Secondary | ICD-10-CM | POA: Diagnosis not present

## 2023-06-05 ENCOUNTER — Ambulatory Visit: Payer: Medicare HMO | Admitting: Cardiology

## 2023-06-16 DIAGNOSIS — R0681 Apnea, not elsewhere classified: Secondary | ICD-10-CM | POA: Diagnosis not present

## 2023-06-17 DIAGNOSIS — R0681 Apnea, not elsewhere classified: Secondary | ICD-10-CM | POA: Diagnosis not present

## 2023-06-17 DIAGNOSIS — G4733 Obstructive sleep apnea (adult) (pediatric): Secondary | ICD-10-CM | POA: Diagnosis not present

## 2023-06-19 ENCOUNTER — Encounter: Payer: Self-pay | Admitting: Psychiatry

## 2023-06-19 ENCOUNTER — Ambulatory Visit: Payer: Medicare HMO | Admitting: Psychiatry

## 2023-06-19 ENCOUNTER — Telehealth: Payer: Self-pay | Admitting: Psychiatry

## 2023-06-19 VITALS — BP 117/79 | HR 77 | Ht 63.5 in | Wt 162.0 lb

## 2023-06-19 DIAGNOSIS — G43E19 Chronic migraine with aura, intractable, without status migrainosus: Secondary | ICD-10-CM

## 2023-06-19 DIAGNOSIS — R42 Dizziness and giddiness: Secondary | ICD-10-CM

## 2023-06-19 DIAGNOSIS — G500 Trigeminal neuralgia: Secondary | ICD-10-CM | POA: Diagnosis not present

## 2023-06-19 DIAGNOSIS — R404 Transient alteration of awareness: Secondary | ICD-10-CM

## 2023-06-19 DIAGNOSIS — Z0100 Encounter for examination of eyes and vision without abnormal findings: Secondary | ICD-10-CM | POA: Diagnosis not present

## 2023-06-19 DIAGNOSIS — H524 Presbyopia: Secondary | ICD-10-CM | POA: Diagnosis not present

## 2023-06-19 MED ORDER — OXCARBAZEPINE 150 MG PO TABS: 150.0000 mg | ORAL_TABLET | Freq: Two times a day (BID) | ORAL | 6 refills | Status: AC

## 2023-06-19 NOTE — Progress Notes (Signed)
   CC:  headaches  Follow-up Visit  Last visit: 11/14/22  Brief HPI: 52 year old female with a history of cervical and lumbar radiculopathy who follows in clinic for facial pain and chronic migraines. MRI was ordered but not done as she could not afford it.  At her last visit she was started on Cymbalta for headaches and facial pain. Maxalt was continued for migraine rescue. MRI and EEG were ordered for new memory issues and syncope.  Interval History: Headaches and facial pain have significantly worsened in the past 2 weeks. Initially she had intermittent pain, but now it is constant. She has not been able to sleep due to pain. Eating, brushing her teeth, and hot/cold drinks trigger the pain. The left side of her nose feels numb. Has developed new blurred vision in her left eye as well. She had side effects with Cymbalta so she stopped it. Believes her worsening headaches are due to facial pain.  States that she had a root canal 1 year ago and some of the dental file was left in her upper gums. She is concerned there may be a new infection causing her worsening symptoms.  EEG was normal. MRI was not done.   Current Headache Regimen: Preventative: none Abortive: Maxalt 10 mg PRN   Prior Therapies                                  Preventive: Topamax - cognitive side effects Atenolol - hypotension Propranolol 80 mg daily Nortriptyline 20 mg QHS - memory issues Cymbalta - side effects   Rescue: Maxalt 10 mg PRN - helped Zofran Reglan  Physical Exam:   Vital Signs: BP 117/79 (BP Location: Right Arm, Patient Position: Sitting, Cuff Size: Normal)   Pulse 77   Ht 5' 3.5" (1.613 m)   Wt 162 lb (73.5 kg)   BMI 28.25 kg/m  GENERAL:  well appearing, in no acute distress, alert  SKIN:  Color, texture, turgor normal. No rashes or lesions HEAD:  Normocephalic/atraumatic. RESP: normal respiratory effort  NEUROLOGICAL: Mental Status: Alert, oriented to person, place and time,  Follows commands, and Speech fluent and appropriate. Cranial Nerves: PERRL,visual fields intact though she notes blurriness in all quadrants OS, decreased sensation left V1-3, no dysarthria, hearing grossly intact Motor: moves all extremities equally Gait: normal-based.  CBC and CMP 04/08/23 unremarkable  IMPRESSION: 52 year old female with a history of cervical and lumbar radiculopathy who presents for follow up of migraines and facial pain. Her facial pain has significantly worsened in the past 2 weeks. She is willing to have the MRI brain/trigeminal nerve done now that her pain is worsening and she has developed blurred vision in the left eye. Will have her schedule these and start oxcarbazepine for trigeminal neuralgia. For now will see if headaches improve with treatment of facial pain. If headaches persist will plan to start a migraine preventive.  PLAN: -Mri brain/trigeminal nerve -Start oxcarbazepine 150 mg BID, uptitrate as needed -Continue Maxalt 10 mg PRN for migraines -Next steps: consider qulipta, CGRP, or Botox for migraines  Follow-up: 6 months, or sooner if needed  I spent a total of 35 minutes on the date of the service. Discussed treatment options including preventive and acute medications. Discussed medication side effects, adverse reactions and drug interactions. Written educational materials and patient instructions outlining all of the above were given.  Ocie Doyne, MD 06/19/23 9:23 AM

## 2023-06-19 NOTE — Telephone Encounter (Signed)
MRI brain [employer] auth pending Tracking #[id number] for [facility]  MRI face/trigeminal [employer] auth: #[id number] exp. 06/19/23-08/18/23 sent to [facility] [phone]

## 2023-06-20 NOTE — Telephone Encounter (Signed)
MRI brain [employer] auth: [id number] exp. 06/19/23-08/18/23

## 2023-06-20 NOTE — Telephone Encounter (Signed)
MRI face CPT codes 78469 and 70542 [employer] auth: [id number] exp. 06/20/23-08/19/23 for [facility].

## 2023-06-23 ENCOUNTER — Encounter (HOSPITAL_COMMUNITY): Payer: Self-pay

## 2023-06-23 ENCOUNTER — Ambulatory Visit (HOSPITAL_COMMUNITY): Admission: RE | Admit: 2023-06-23 | Payer: Medicare HMO | Source: Ambulatory Visit

## 2023-07-08 ENCOUNTER — Encounter: Payer: Self-pay | Admitting: Psychiatry

## 2023-07-08 DIAGNOSIS — G500 Trigeminal neuralgia: Secondary | ICD-10-CM

## 2023-07-08 NOTE — Addendum Note (Signed)
Addended by: Judi Cong on: 07/08/2023 04:35 PM   Modules accepted: Orders

## 2023-07-08 NOTE — Telephone Encounter (Signed)
Spoke with [facility] regarding patients MRI order. They stated that patient recently had a root canal completed, and during the procedure, a piece of the dental file broke off. They are asking for a mandible x ray order be placed for prior to her having the MRI completed. They stated the x ray should be a mandible 4 view in EPIC, sent to [facility] for completion. The reason for the x ray should be listed as foreign body and "screening for MRI" can be in the notes.   They can be called back at [phone] but they will keep an eye out for the order to come through and get both scheduled for the same day.

## 2023-07-09 NOTE — Telephone Encounter (Signed)
[facility], am I work -in [facility].?

## 2023-07-09 NOTE — Telephone Encounter (Signed)
Order was already placed for the patient and has been taken care of.

## 2024-01-20 ENCOUNTER — Ambulatory Visit: Payer: Medicare HMO | Admitting: Family Medicine

## 2024-01-20 ENCOUNTER — Ambulatory Visit: Payer: Medicare HMO | Admitting: Neurology
